# Patient Record
Sex: Female | Born: 1995 | Race: Black or African American | Hispanic: No | Marital: Single | Smoking: Never smoker
Health system: Southern US, Community
[De-identification: ages and names within clinical notes are randomized; demographics above are authoritative.]

---
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
## 2017-02-15 IMAGING — DX DG WRIST COMPLETE 3+V*R*
3 series · 3 of 3 positions shown · non-contrast
Comparison: None.

CLINICAL DATA: Acute right wrist pain from overuse.

EXAM:
RIGHT WRIST - COMPLETE 3+ VIEW

[wrist pa]
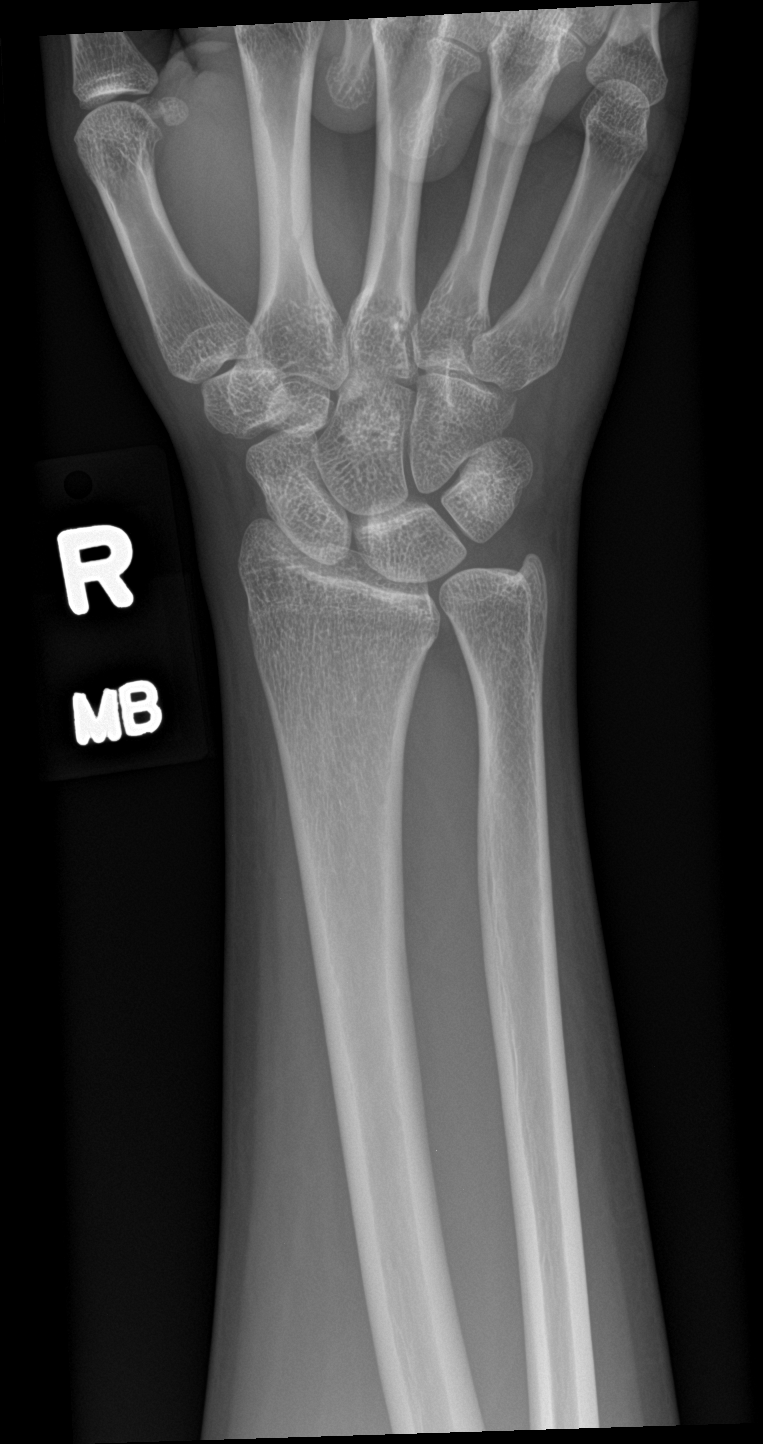

[wrist obl]
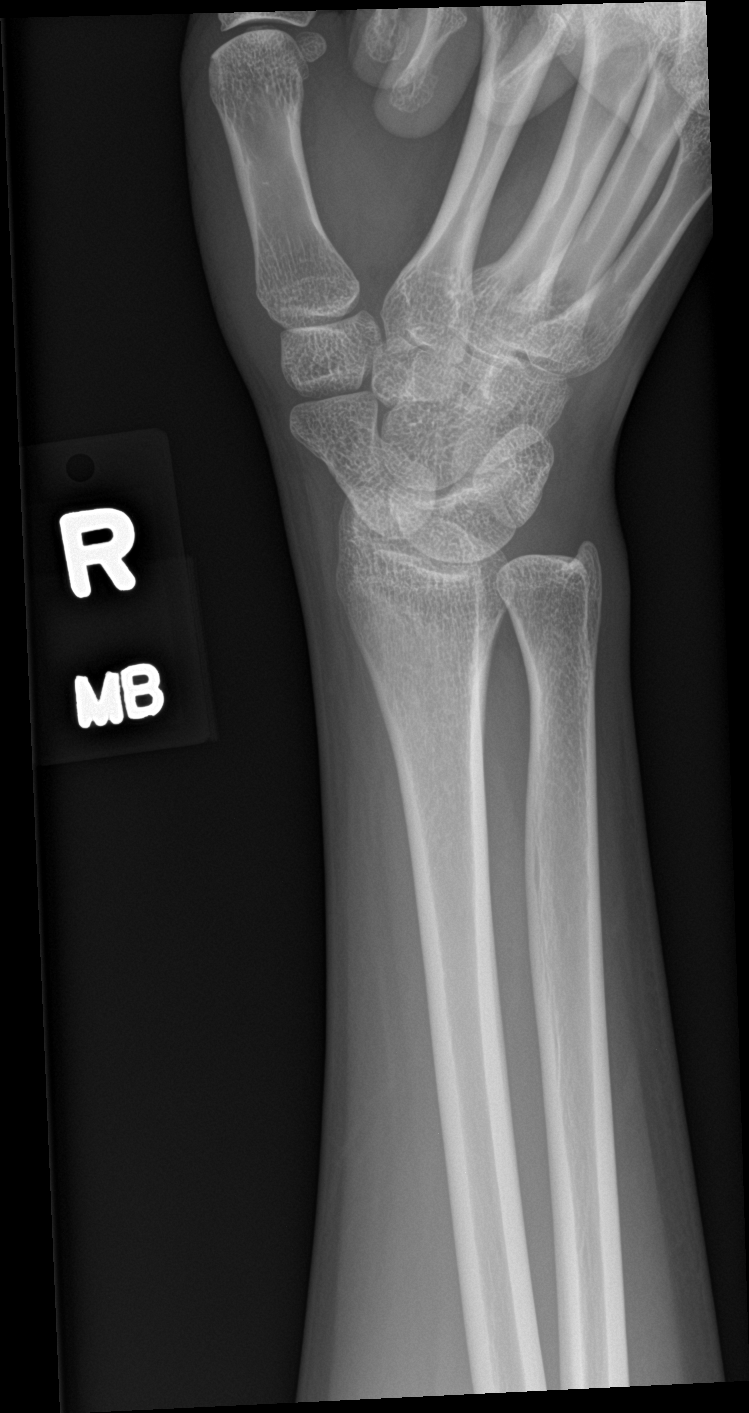

[wrist lat]
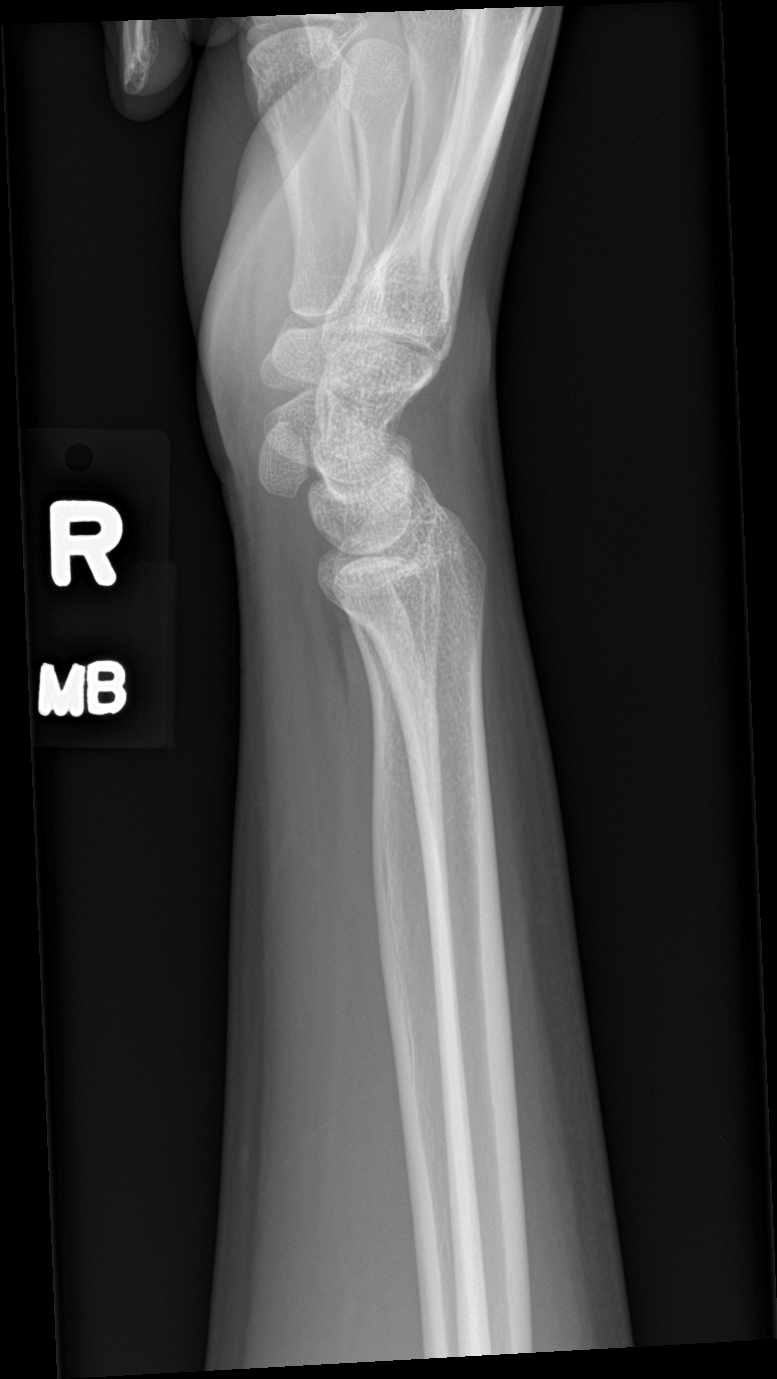

[3 of 3 positions shown; findings below may reference images not displayed]

FINDINGS: There is no evidence of fracture or dislocation. There is no
evidence of arthropathy or other focal bone abnormality. Soft
tissues are unremarkable.
IMPRESSION: Normal right wrist.
